# Patient Record
Sex: Male | Born: 1950
Health system: Southern US, Community
[De-identification: ages and names within clinical notes are randomized; demographics above are authoritative.]

## PROBLEM LIST (undated history)

## (undated) DIAGNOSIS — E781 Pure hyperglyceridemia: Secondary | ICD-10-CM

## (undated) DIAGNOSIS — R001 Bradycardia, unspecified: Secondary | ICD-10-CM

## (undated) DIAGNOSIS — N529 Male erectile dysfunction, unspecified: Secondary | ICD-10-CM

## (undated) DIAGNOSIS — N200 Calculus of kidney: Secondary | ICD-10-CM

## (undated) HISTORY — DX: Male erectile dysfunction, unspecified: N52.9

## (undated) HISTORY — DX: Pure hyperglyceridemia: E78.1

## (undated) HISTORY — DX: Calculus of kidney: N20.0

## (undated) HISTORY — DX: Bradycardia, unspecified: R00.1

---

## 2000-09-27 ENCOUNTER — Emergency Department (HOSPITAL_COMMUNITY): Admission: EM | Admit: 2000-09-27 | Discharge: 2000-09-27 | Payer: Self-pay | Admitting: Emergency Medicine

## 2000-09-27 ENCOUNTER — Encounter: Payer: Self-pay | Admitting: Emergency Medicine

## 2000-09-27 ENCOUNTER — Encounter: Payer: Self-pay | Admitting: Orthopedic Surgery

## 2003-04-21 ENCOUNTER — Encounter: Payer: Self-pay | Admitting: Emergency Medicine

## 2003-04-21 ENCOUNTER — Emergency Department (HOSPITAL_COMMUNITY): Admission: EM | Admit: 2003-04-21 | Discharge: 2003-04-21 | Payer: Self-pay | Admitting: Emergency Medicine

## 2014-04-04 ENCOUNTER — Other Ambulatory Visit: Payer: Self-pay | Admitting: Allergy and Immunology

## 2014-04-04 ENCOUNTER — Ambulatory Visit
Admission: RE | Admit: 2014-04-04 | Discharge: 2014-04-04 | Disposition: A | Discharge status: Home / Self Care | Payer: BC Managed Care – PPO | Source: Ambulatory Visit | Attending: Allergy and Immunology | Admitting: Allergy and Immunology

## 2014-04-04 DIAGNOSIS — J45901 Unspecified asthma with (acute) exacerbation: Secondary | ICD-10-CM

## 2017-03-17 ENCOUNTER — Telehealth: Payer: Self-pay | Admitting: *Deleted

## 2017-03-17 NOTE — Telephone Encounter (Signed)
NOTES SENT TO SCHEDULING.  °

## 2017-03-18 ENCOUNTER — Telehealth: Payer: Self-pay

## 2017-03-18 NOTE — Telephone Encounter (Signed)
SENT NOTES TO [facility] ON 03/18/2017

## 2017-03-21 ENCOUNTER — Telehealth: Payer: Self-pay | Admitting: Internal Medicine

## 2017-03-21 NOTE — Telephone Encounter (Signed)
Received records from [facility] for appointment on 04/17/17 with Dr Rennis Golden.  Records put with Dr Blanchie Dessert schedule for 04/17/17. lp

## 2017-04-17 ENCOUNTER — Encounter: Payer: Self-pay | Admitting: Internal Medicine

## 2017-04-17 ENCOUNTER — Ambulatory Visit (INDEPENDENT_AMBULATORY_CARE_PROVIDER_SITE_OTHER): Payer: Medicare Other | Admitting: Internal Medicine

## 2017-04-17 VITALS — BP 131/87 | HR 62 | Ht 74.0 in | Wt 205.0 lb

## 2017-04-17 DIAGNOSIS — R001 Bradycardia, unspecified: Secondary | ICD-10-CM | POA: Diagnosis not present

## 2017-04-17 NOTE — Progress Notes (Signed)
OFFICE CONSULT NOTE  Chief Complaint:  Asymptomatic bradycardia  Primary Care Physician: Marcelo BaldyMAUNEY, JESSICA S, PA-C  HPI:  Nathan Mendez is a 66 y.o. male who is being seen today for the evaluation of bradycardia at the request of Marcelo BaldyMauney, Jessica S, [place]. 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 He also has seasonal allergies/asthma. He only takes medications essentially for his asthma and allergies. Family history significant for coronary artery disease in his father who died of a heart attack in his 8250s however he was apparently overweight and a heavy smoker. Nathan Mendez recently had a wellness visit and was noted to be bradycardic. EKG was performed indicating a sinus bradycardia with heart rate of 49 which I personally reviewed. I also personally reviewed lab work from his PCP including electrolytes which were normal. Creatinine in normal limits. A total cholesterol 213, triglycerides 159, HDL 48 and LDL-C of 133. Non-HDL of 165. He is not currently on therapy. He reports being active and exercising regularly. He only drinks a couple alcoholic beverages a week. He denies any drug use or tobacco use. With regards to bradycardia he says he is completely asymptomatic. When he exercises he notes his heart rate does go up with exercise accordingly up into the 120-140 range. He denies any chest pain, worsening shortness of breath, presyncope, syncope or other associated symptoms.  PMHx:  Past Medical History:  Diagnosis Date  . ED (erectile dysfunction)   . High triglycerides   . Kidney stone   . Sinus bradycardia     No past surgical history on file.  FAMHx:  Family History  Problem Relation Age of Onset  . Heart failure Mother   . Heart attack Father     SOCHx:   reports that he has never smoked. He has never used smokeless tobacco. He reports that he does not  use drugs. His alcohol history is not on file.  ALLERGIES:  Allergies  Allergen Reactions  . Hornet Venom Swelling  . Yellow Jacket Venom [Bee Venom] Swelling    ROS: Pertinent items noted in HPI and remainder of comprehensive ROS otherwise negative.  HOME MEDS: Current Outpatient Prescriptions on File Prior to Visit  Medication Sig Dispense Refill  . albuterol (PROVENTIL HFA;VENTOLIN HFA) 108 (90 Base) MCG/ACT inhaler Inhale 2 puffs into the lungs every 6 (six) hours as needed for wheezing or shortness of breath.    . EPINEPHrine 0.3 mg/0.3 mL IJ SOAJ injection Inject 0.3 mg into the muscle once.    . loratadine (CLARITIN) 10 MG tablet Take 10 mg by mouth daily.    . mometasone-formoterol (DULERA) 100-5 MCG/ACT AERO Inhale 2 puffs into the lungs 2 (two) times daily.    . montelukast (SINGULAIR) 10 MG tablet Take 10 mg by mouth every evening.  5  . sildenafil (VIAGRA) 100 MG tablet Take 50 mg by mouth daily as needed for erectile dysfunction.     No current facility-administered medications on file prior to visit.     LABS/IMAGING: No results found for this or any previous visit (from the past 48 hour(s)). No results found.  LIPID PANEL: No results found for: CHOL, TRIG, HDL, CHOLHDL, VLDL, LDLCALC, LDLDIRECT  WEIGHTS: Wt Readings from Last 3 Encounters:  04/17/17 205 lb (93 kg)    VITALS: BP 131/87   Pulse 62   Ht 6\' 2"  (1.88 m)   Wt 205 lb (93 kg)  BMI 26.32 kg/m   EXAM: General appearance: alert, no distress and thin Neck: no carotid bruit, no JVD and thyroid not enlarged, symmetric, no tenderness/mass/nodules Lungs: clear to auscultation bilaterally Heart: regular rate and rhythm, S1, S2 normal, no murmur, click, rub or gallop Abdomen: soft, non-tender; bowel sounds normal; no masses,  no organomegaly Extremities: extremities normal, atraumatic, no cyanosis or edema Pulses: 2+ and symmetric Skin: Skin color, texture, turgor normal. No rashes or  lesions Neurologic: Grossly normal Psych: Pleasant  EKG: Normal sinus rhythm at 60, minimal voltage criteria for LVH - personally reviewed  ASSESSMENT: 1. Asymptomatic sinus bradycardia  PLAN: 1.   [name] has a dramatic sinus bradycardia. There is no evidence of that today. He reports heart rate increase with exercise suggesting he has normal chronotropic competence. He has had no anginal symptoms. Given his high exercise tolerance I don't think it would be beneficial for him to undergo stress testing as he essentially is able to do that when he exercises. He occasionally has some acid reflux which could be a vagal stimulant. High vagal tone could explain his low heart rate at times although this is unusual that it is a new finding. Could also suggest the development of conduction system disease, although the EKG does not suggest that this time. I've encouraged him to monitor his heart rate with exercise and look for any further symptoms. I did offer stress testing although he politely declined. It is unlikely that it would show anything different than what he normally sees with exercise. He should avoid any rate lowering medications.  Thanks for the consultation. Follow-up with me as needed.  Chrystie Nose, MD, [facility]  [place]  [facility]  Attending Cardiologist  Direct Dial: [phone]  Fax: [phone]  Website:  [email or web address]  Lisette Abu Katherinne Mofield 04/17/2017, 10:30 AM

## 2017-04-17 NOTE — Patient Instructions (Signed)
Your physician recommends that you schedule a follow-up appointment as needed  

## 2017-05-05 ENCOUNTER — Ambulatory Visit (INDEPENDENT_AMBULATORY_CARE_PROVIDER_SITE_OTHER): Payer: Medicare Other | Admitting: Podiatry

## 2017-05-05 ENCOUNTER — Encounter: Payer: Self-pay | Admitting: Podiatry

## 2017-05-05 VITALS — BP 150/98 | HR 55 | Resp 16 | Ht 74.0 in | Wt 190.0 lb

## 2017-05-05 DIAGNOSIS — L600 Ingrowing nail: Secondary | ICD-10-CM | POA: Diagnosis not present

## 2017-05-05 DIAGNOSIS — B351 Tinea unguium: Secondary | ICD-10-CM

## 2017-05-05 NOTE — Progress Notes (Signed)
   Subjective:    Patient ID: Nathan Mendez, male    DOB: 23-Jun-1951, 66 y.o.   MRN: [id number]  HPI Chief Complaint  Patient presents with  . Nail Problem    Left foot; great toe; nail discoloration & thickened nail; pt stated, "Goes hiking; has had for past year"      Review of Systems  All other systems reviewed and are negative.      Objective:   Physical Exam        Assessment & Plan:

## 2017-05-06 NOTE — Progress Notes (Signed)
Subjective:    Patient ID: Nathan Mendez, male   DOB: 66 y.o.   MRN: [id number]   HPI patient presents stating he's had a lot of thickness of his toenails and it makes it difficult at times for him to wear shoe gear comfortably and he like to know if it can be treated    Review of Systems  All other systems reviewed and are negative.       Objective:  Physical Exam  Constitutional: He is oriented to person, place, and time.  Cardiovascular: Intact distal pulses.   Musculoskeletal: Normal range of motion.  Neurological: He is alert and oriented to person, place, and time.  Skin: Skin is warm.  Nursing note and vitals reviewed.  neurovascular status intact muscle strength adequate with significant damage to the hallux nail left with incurvation of the bed and mild damage right. Patient very active hiker and stated been occurring over the last few years     Assessment:    Damage nailbed left hallux with thickness incurvation and minimal discomfort     Plan:   I educated him on the differences between fungus and probable trauma and that most of the problem here appears to be trauma. Patient at this time we'll not have treatment but may require nail removal or other treatment in future

## 2020-01-12 ENCOUNTER — Ambulatory Visit: Payer: Medicare Other

## 2020-02-02 ENCOUNTER — Ambulatory Visit: Payer: Medicare Other

## 2020-08-24 DIAGNOSIS — Z20828 Contact with and (suspected) exposure to other viral communicable diseases: Secondary | ICD-10-CM | POA: Diagnosis not present

## 2020-12-06 DIAGNOSIS — N529 Male erectile dysfunction, unspecified: Secondary | ICD-10-CM | POA: Diagnosis not present

## 2020-12-06 DIAGNOSIS — Z79899 Other long term (current) drug therapy: Secondary | ICD-10-CM | POA: Diagnosis not present

## 2020-12-06 DIAGNOSIS — J309 Allergic rhinitis, unspecified: Secondary | ICD-10-CM | POA: Diagnosis not present

## 2020-12-06 DIAGNOSIS — E663 Overweight: Secondary | ICD-10-CM | POA: Diagnosis not present

## 2020-12-06 DIAGNOSIS — E785 Hyperlipidemia, unspecified: Secondary | ICD-10-CM | POA: Diagnosis not present

## 2020-12-06 DIAGNOSIS — T63441D Toxic effect of venom of bees, accidental (unintentional), subsequent encounter: Secondary | ICD-10-CM | POA: Diagnosis not present

## 2020-12-06 DIAGNOSIS — J4520 Mild intermittent asthma, uncomplicated: Secondary | ICD-10-CM | POA: Diagnosis not present

## 2020-12-06 DIAGNOSIS — Z6827 Body mass index (BMI) 27.0-27.9, adult: Secondary | ICD-10-CM | POA: Diagnosis not present

## 2021-03-15 DIAGNOSIS — J3081 Allergic rhinitis due to animal (cat) (dog) hair and dander: Secondary | ICD-10-CM | POA: Diagnosis not present

## 2021-03-15 DIAGNOSIS — J301 Allergic rhinitis due to pollen: Secondary | ICD-10-CM | POA: Diagnosis not present

## 2021-03-15 DIAGNOSIS — J3089 Other allergic rhinitis: Secondary | ICD-10-CM | POA: Diagnosis not present

## 2021-03-15 DIAGNOSIS — J4540 Moderate persistent asthma, uncomplicated: Secondary | ICD-10-CM | POA: Diagnosis not present

## 2021-03-22 ENCOUNTER — Ambulatory Visit (INDEPENDENT_AMBULATORY_CARE_PROVIDER_SITE_OTHER): Payer: Medicare PPO

## 2021-03-22 ENCOUNTER — Other Ambulatory Visit: Payer: Self-pay

## 2021-03-22 ENCOUNTER — Encounter (HOSPITAL_COMMUNITY): Payer: Self-pay | Admitting: *Deleted

## 2021-03-22 ENCOUNTER — Ambulatory Visit (HOSPITAL_COMMUNITY)
Admission: EM | Admit: 2021-03-22 | Discharge: 2021-03-22 | Disposition: A | Discharge status: Home / Self Care | Payer: Medicare PPO | Attending: Emergency Medicine | Admitting: Emergency Medicine

## 2021-03-22 DIAGNOSIS — R0781 Pleurodynia: Secondary | ICD-10-CM

## 2021-03-22 DIAGNOSIS — S2241XA Multiple fractures of ribs, right side, initial encounter for closed fracture: Secondary | ICD-10-CM | POA: Diagnosis not present

## 2021-03-22 DIAGNOSIS — X500XXA Overexertion from strenuous movement or load, initial encounter: Secondary | ICD-10-CM | POA: Diagnosis not present

## 2021-03-22 DIAGNOSIS — R0789 Other chest pain: Secondary | ICD-10-CM | POA: Diagnosis not present

## 2021-03-22 NOTE — ED Triage Notes (Signed)
Pt reports Rt upper rib pain after lifting bags of dirt on Tuesday. Pt reports pain worse at night when going to bed.

## 2021-03-22 NOTE — ED Provider Notes (Signed)
[facility]    CSN: [id number] Arrival date & time: 03/22/21  0809      History   Chief Complaint Chief Complaint  Patient presents with  . Chest Pain    HPI Nathan Mendez is a 70 y.o. male.   Patient is here for evaluation of right rib pain.  Reports pain started Tuesday evening after lifting heavy bags of dirt.  Reports being unable to lay flat on ribs.  Pain worsens throughout the day.  Has not tried any OTC medications or treatments.  Primarily concerned about possible rib fracture as he will be traveling outside of the country in the near future. Denies any fevers, chest pain, shortness of breath, N/V/D, numbness, tingling, weakness, abdominal pain, or headaches.   Also has 2 small abrasions to the top of his head from where he hit his head.  Reports they are healing nicely but then recently hit his head again and wanted to make sure there were not no signs of any additional trauma.  Denies any headaches, dizziness, blurred vision.  Denies any uncontrolled bleeding or drainage from site.   ROS: As per HPI, all other pertinent ROS negative   The history is provided by the patient.  Chest Pain Associated symptoms: no palpitations     Past Medical History:  Diagnosis Date  . ED (erectile dysfunction)   . High triglycerides   . Kidney stone   . Sinus bradycardia     Patient Active Problem List   Diagnosis Date Noted  . Bradycardia 04/17/2017    History reviewed. No pertinent surgical history.     Home Medications    Prior to Admission medications   Medication Sig Start Date End Date Taking? Authorizing Provider  albuterol (PROVENTIL HFA;VENTOLIN HFA) 108 (90 Base) MCG/ACT inhaler Inhale 2 puffs into the lungs every 6 (six) hours as needed for wheezing or shortness of breath.    [provider]  EPINEPHrine 0.3 mg/0.3 mL IJ SOAJ injection Inject 0.3 mg into the muscle once.    [provider]  loratadine (CLARITIN) 10 MG tablet Take  10 mg by mouth daily.    [provider]  mometasone-formoterol (DULERA) 100-5 MCG/ACT AERO Inhale 2 puffs into the lungs 2 (two) times daily.    [provider]  montelukast (SINGULAIR) 10 MG tablet Take 10 mg by mouth every evening. 01/14/17   [provider]  sildenafil (VIAGRA) 100 MG tablet Take 50 mg by mouth daily as needed for erectile dysfunction.    [provider]    Family History Family History  Problem Relation Age of Onset  . Heart failure Mother   . Heart attack Father     Social History Social History   Tobacco Use  . Smoking status: Never Smoker  . Smokeless tobacco: Never Used  Substance Use Topics  . Drug use: No     Allergies   Hornet venom and Yellow jacket venom [bee venom]   Review of Systems Review of Systems  Cardiovascular: Positive for chest pain. Negative for palpitations and leg swelling.  Skin: Positive for wound.     Physical Exam Triage Vital Signs ED Triage Vitals  Enc Vitals Group     BP 03/22/21 0826 (!) 144/84     Pulse Rate 03/22/21 0826 71     Resp 03/22/21 0826 18     Temp 03/22/21 0826 98.2 F (36.8 C)     Temp Source 03/22/21 0826 Oral     SpO2  03/22/21 0826 95 %     Weight --      Height --      Head Circumference --      Peak Flow --      Pain Score 03/22/21 0823 8     Pain Loc --      Pain Edu? --      Excl. in GC? --    No data found.  Updated Vital Signs BP (!) 144/84 (BP Location: Left Arm)   Pulse 71   Temp 98.2 F (36.8 C) (Oral)   Resp 18   SpO2 95%   Visual Acuity Right Eye Distance:   Left Eye Distance:   Bilateral Distance:    Right Eye Near:   Left Eye Near:    Bilateral Near:     Physical Exam Vitals and nursing note reviewed.  Constitutional:      General: He is not in acute distress.    Appearance: Normal appearance. He is not ill-appearing, toxic-appearing or diaphoretic.  HENT:     Head: Normocephalic and atraumatic.  Eyes:      Conjunctiva/sclera: Conjunctivae normal.  Cardiovascular:     Rate and Rhythm: Normal rate.     Pulses: Normal pulses.  Pulmonary:     Effort: Pulmonary effort is normal.  Chest:     Chest wall: Tenderness (right rib cage tenderness with minimal radiatation) present. No mass, deformity, crepitus or edema. There is no dullness to percussion.  Abdominal:     General: Abdomen is flat.  Musculoskeletal:        General: Normal range of motion.     Cervical back: Normal range of motion.  Skin:    General: Skin is warm and dry.     Findings: Abrasion (two small abraisions to top of head, bleeding controlled with no redness or swelling, no tenderness, appears to be healing well) present.  Neurological:     General: No focal deficit present.     Mental Status: He is alert and oriented to person, place, and time.  Psychiatric:        Mood and Affect: Mood normal.      UC Treatments / Results  Labs (all labs ordered are listed, but only abnormal results are displayed) Labs Reviewed - No data to display  EKG   Radiology DG Ribs Unilateral W/Chest Right  Result Date: 03/22/2021 CLINICAL DATA:  Pain after lifting heavy objects EXAM: RIGHT RIBS AND CHEST - 3+ VIEW COMPARISON:  Chest radiograph April 04, 2014. FINDINGS: Frontal chest as well as oblique and cone-down rib images obtained. There is atelectatic change in the right base with equivocal right pleural effusion. The lungs elsewhere are clear. The heart size and pulmonary vascularity are normal. No adenopathy. No pneumothorax or pleural effusion. Old healed fractures of the anterior right ninth and tenth ribs obtained. No acute appearing fracture is evident. IMPRESSION: Prior fractures of the right ninth and tenth ribs anteriorly with apparent healing response. No acute fracture evident. Equivocal right pleural effusion with right base atelectasis noted. No evident pneumothorax. Lungs otherwise clear. Electronically Signed   By: Bretta Bang III M.D.   On: 03/22/2021 09:08    Procedures Procedures (including critical care time)  Medications Ordered in UC Medications - No data to display  Initial Impression / Assessment and Plan / UC Course  I have reviewed the triage vital signs and the nursing notes.  Pertinent labs & imaging results that were available during my care of  the patient were reviewed by me and considered in my medical decision making (see chart for details).     For red flags or concerns.  X-ray showed healing fractures of the right ninth and 10th anterior ribs but no acute fractures.  Incidental finding of right pleural effusion.  Discussed finding with patient and recommend follow-up with PCP.  Discussed following up or going to the emergency room for any worsening shortness of breath, chest pain, cough, or coughing up bloody sputum. Continue conservative management with ibuprofen and/or Tylenol as needed for pain.  Offered to prescribe naproxen and muscle relaxer but declined at this time.  Recommend alternating heat and ice for comfort. Follow-up with primary care as needed.  Final Clinical Impressions(s) / UC Diagnoses   Final diagnoses:  Chest wall pain  Rib pain on right side     Discharge Instructions     Follow up with your Primary Care Provider for the fluid on your lung.   Make sure that you are taking deep breathes, brace your ribs when coughing for comfort.   Take the Tylenol and/or Ibuprofen as needed for pain.   Return or go to the Emergency Department if symptoms worsen or do not improve in the next few days.      ED Prescriptions    None     PDMP not reviewed this encounter.   Ivette Loyal, NP 03/22/21 [phone]

## 2021-03-22 NOTE — Discharge Instructions (Signed)
Follow up with your Primary Care Provider for the fluid on your lung.   Make sure that you are taking deep breathes, brace your ribs when coughing for comfort.   Take the Tylenol and/or Ibuprofen as needed for pain.   Return or go to the Emergency Department if symptoms worsen or do not improve in the next few days.

## 2021-03-28 ENCOUNTER — Other Ambulatory Visit: Payer: Medicare PPO | Admitting: Family Medicine

## 2021-03-28 DIAGNOSIS — J90 Pleural effusion, not elsewhere classified: Secondary | ICD-10-CM | POA: Diagnosis not present

## 2021-03-28 DIAGNOSIS — Z6827 Body mass index (BMI) 27.0-27.9, adult: Secondary | ICD-10-CM | POA: Diagnosis not present

## 2021-03-28 DIAGNOSIS — Z91030 Bee allergy status: Secondary | ICD-10-CM | POA: Diagnosis not present

## 2021-03-28 DIAGNOSIS — R0789 Other chest pain: Secondary | ICD-10-CM | POA: Diagnosis not present

## 2021-03-29 ENCOUNTER — Ambulatory Visit
Admission: RE | Admit: 2021-03-29 | Discharge: 2021-03-29 | Disposition: A | Discharge status: Home / Self Care | Payer: Medicare PPO | Source: Ambulatory Visit | Attending: Family Medicine | Admitting: Family Medicine

## 2021-03-29 ENCOUNTER — Other Ambulatory Visit: Payer: Self-pay

## 2021-03-29 DIAGNOSIS — J929 Pleural plaque without asbestos: Secondary | ICD-10-CM | POA: Diagnosis not present

## 2021-03-29 DIAGNOSIS — I2699 Other pulmonary embolism without acute cor pulmonale: Secondary | ICD-10-CM | POA: Diagnosis not present

## 2021-03-29 DIAGNOSIS — J90 Pleural effusion, not elsewhere classified: Secondary | ICD-10-CM

## 2021-03-29 DIAGNOSIS — Z125 Encounter for screening for malignant neoplasm of prostate: Secondary | ICD-10-CM | POA: Diagnosis not present

## 2021-03-29 DIAGNOSIS — K449 Diaphragmatic hernia without obstruction or gangrene: Secondary | ICD-10-CM | POA: Diagnosis not present

## 2021-03-29 MED ORDER — IOPAMIDOL (ISOVUE-370) INJECTION 76%
75.0000 mL | Freq: Once | INTRAVENOUS | Status: AC | PRN
  Administered 2021-03-29: 75 mL via INTRAVENOUS

## 2021-04-02 DIAGNOSIS — J90 Pleural effusion, not elsewhere classified: Secondary | ICD-10-CM | POA: Diagnosis not present

## 2021-04-02 DIAGNOSIS — J4520 Mild intermittent asthma, uncomplicated: Secondary | ICD-10-CM | POA: Diagnosis not present

## 2021-04-02 DIAGNOSIS — I2693 Single subsegmental pulmonary embolism without acute cor pulmonale: Secondary | ICD-10-CM | POA: Diagnosis not present

## 2021-04-02 DIAGNOSIS — J189 Pneumonia, unspecified organism: Secondary | ICD-10-CM | POA: Diagnosis not present

## 2021-06-01 DIAGNOSIS — U071 COVID-19: Secondary | ICD-10-CM | POA: Diagnosis not present

## 2021-07-09 DIAGNOSIS — J189 Pneumonia, unspecified organism: Secondary | ICD-10-CM | POA: Diagnosis not present

## 2021-07-09 DIAGNOSIS — Z6827 Body mass index (BMI) 27.0-27.9, adult: Secondary | ICD-10-CM | POA: Diagnosis not present

## 2021-07-09 DIAGNOSIS — I2693 Single subsegmental pulmonary embolism without acute cor pulmonale: Secondary | ICD-10-CM | POA: Diagnosis not present

## 2021-07-17 ENCOUNTER — Other Ambulatory Visit: Payer: Self-pay | Admitting: Family Medicine

## 2021-07-17 DIAGNOSIS — I2693 Single subsegmental pulmonary embolism without acute cor pulmonale: Secondary | ICD-10-CM

## 2021-07-30 ENCOUNTER — Ambulatory Visit
Admission: RE | Admit: 2021-07-30 | Discharge: 2021-07-30 | Disposition: A | Discharge status: Home / Self Care | Payer: Medicare PPO | Source: Ambulatory Visit | Attending: Family Medicine | Admitting: Family Medicine

## 2021-07-30 ENCOUNTER — Other Ambulatory Visit: Payer: Self-pay

## 2021-07-30 DIAGNOSIS — N200 Calculus of kidney: Secondary | ICD-10-CM | POA: Diagnosis not present

## 2021-07-30 DIAGNOSIS — I2693 Single subsegmental pulmonary embolism without acute cor pulmonale: Secondary | ICD-10-CM

## 2021-07-30 MED ORDER — IOPAMIDOL (ISOVUE-370) INJECTION 76%
75.0000 mL | Freq: Once | INTRAVENOUS | Status: AC | PRN
  Administered 2021-07-30: 75 mL via INTRAVENOUS

## 2021-12-07 DIAGNOSIS — N529 Male erectile dysfunction, unspecified: Secondary | ICD-10-CM | POA: Diagnosis not present

## 2021-12-07 DIAGNOSIS — Z86711 Personal history of pulmonary embolism: Secondary | ICD-10-CM | POA: Diagnosis not present

## 2021-12-07 DIAGNOSIS — E785 Hyperlipidemia, unspecified: Secondary | ICD-10-CM | POA: Diagnosis not present

## 2021-12-21 DIAGNOSIS — Z0000 Encounter for general adult medical examination without abnormal findings: Secondary | ICD-10-CM | POA: Diagnosis not present

## 2021-12-21 DIAGNOSIS — Z1389 Encounter for screening for other disorder: Secondary | ICD-10-CM | POA: Diagnosis not present

## 2022-03-28 DIAGNOSIS — J3081 Allergic rhinitis due to animal (cat) (dog) hair and dander: Secondary | ICD-10-CM | POA: Diagnosis not present

## 2022-03-28 DIAGNOSIS — J301 Allergic rhinitis due to pollen: Secondary | ICD-10-CM | POA: Diagnosis not present

## 2022-03-28 DIAGNOSIS — J4540 Moderate persistent asthma, uncomplicated: Secondary | ICD-10-CM | POA: Diagnosis not present

## 2022-03-28 DIAGNOSIS — J3089 Other allergic rhinitis: Secondary | ICD-10-CM | POA: Diagnosis not present

## 2022-03-31 ENCOUNTER — Other Ambulatory Visit: Payer: Self-pay

## 2022-03-31 ENCOUNTER — Ambulatory Visit (HOSPITAL_COMMUNITY)
Admission: RE | Admit: 2022-03-31 | Discharge: 2022-03-31 | Disposition: A | Discharge status: Home / Self Care | Payer: Medicare PPO | Source: Ambulatory Visit | Attending: Physician Assistant | Admitting: Physician Assistant

## 2022-03-31 ENCOUNTER — Other Ambulatory Visit: Payer: Self-pay | Admitting: Physician Assistant

## 2022-03-31 ENCOUNTER — Emergency Department (HOSPITAL_COMMUNITY)
Admission: EM | Admit: 2022-03-31 | Discharge: 2022-03-31 | Disposition: A | Discharge status: Home / Self Care | Payer: Medicare PPO | Attending: Emergency Medicine | Admitting: Emergency Medicine

## 2022-03-31 ENCOUNTER — Emergency Department (HOSPITAL_COMMUNITY): Admission: EM | Admit: 2022-03-31 | Payer: Medicare PPO | Source: Home / Self Care

## 2022-03-31 ENCOUNTER — Emergency Department (HOSPITAL_COMMUNITY): Payer: Medicare PPO

## 2022-03-31 ENCOUNTER — Emergency Department (HOSPITAL_BASED_OUTPATIENT_CLINIC_OR_DEPARTMENT_OTHER): Payer: Medicare PPO

## 2022-03-31 ENCOUNTER — Encounter (HOSPITAL_COMMUNITY): Payer: Self-pay

## 2022-03-31 DIAGNOSIS — R001 Bradycardia, unspecified: Secondary | ICD-10-CM

## 2022-03-31 DIAGNOSIS — R55 Syncope and collapse: Secondary | ICD-10-CM

## 2022-03-31 DIAGNOSIS — R61 Generalized hyperhidrosis: Secondary | ICD-10-CM | POA: Diagnosis not present

## 2022-03-31 DIAGNOSIS — M25571 Pain in right ankle and joints of right foot: Secondary | ICD-10-CM | POA: Diagnosis not present

## 2022-03-31 DIAGNOSIS — L0390 Cellulitis, unspecified: Secondary | ICD-10-CM | POA: Diagnosis not present

## 2022-03-31 DIAGNOSIS — M79604 Pain in right leg: Secondary | ICD-10-CM | POA: Diagnosis not present

## 2022-03-31 DIAGNOSIS — I959 Hypotension, unspecified: Secondary | ICD-10-CM | POA: Diagnosis not present

## 2022-03-31 DIAGNOSIS — M79605 Pain in left leg: Secondary | ICD-10-CM | POA: Diagnosis not present

## 2022-03-31 DIAGNOSIS — M79662 Pain in left lower leg: Secondary | ICD-10-CM | POA: Diagnosis not present

## 2022-03-31 DIAGNOSIS — E860 Dehydration: Secondary | ICD-10-CM | POA: Insufficient documentation

## 2022-03-31 DIAGNOSIS — M76821 Posterior tibial tendinitis, right leg: Secondary | ICD-10-CM | POA: Diagnosis not present

## 2022-03-31 LAB — BASIC METABOLIC PANEL
Anion gap: 10 (ref 5–15)
BUN: 17 mg/dL (ref 8–23)
CO2: 24 mmol/L (ref 22–32)
Calcium: 8.8 mg/dL — ABNORMAL LOW (ref 8.9–10.3)
Chloride: 103 mmol/L (ref 98–111)
Creatinine, Ser: 1.39 mg/dL — ABNORMAL HIGH (ref 0.61–1.24)
GFR, Estimated: 55 mL/min — ABNORMAL LOW (ref >60)
Glucose, Bld: 139 mg/dL — ABNORMAL HIGH (ref 70–99)
Potassium: 4.8 mmol/L (ref 3.5–5.1)
Sodium: 137 mmol/L (ref 135–145)

## 2022-03-31 LAB — CBC WITH DIFFERENTIAL/PLATELET
Abs Immature Granulocytes: 0.05 10*3/uL (ref 0.00–0.07)
Basophils Absolute: 0.1 10*3/uL (ref 0.0–0.1)
Basophils Relative: 1 %
Eosinophils Absolute: 0.1 10*3/uL (ref 0.0–0.5)
Eosinophils Relative: 1 %
HCT: 50.6 % (ref 39.0–52.0)
Hemoglobin: 16.3 g/dL (ref 13.0–17.0)
Immature Granulocytes: 0 %
Lymphocytes Relative: 13 %
Lymphs Abs: 1.6 10*3/uL (ref 0.7–4.0)
MCH: 31.2 pg (ref 26.0–34.0)
MCHC: 32.2 g/dL (ref 30.0–36.0)
MCV: 96.9 fL (ref 80.0–100.0)
Monocytes Absolute: 0.8 10*3/uL (ref 0.1–1.0)
Monocytes Relative: 6 %
Neutro Abs: 9.6 10*3/uL — ABNORMAL HIGH (ref 1.7–7.7)
Neutrophils Relative %: 79 %
Platelets: 227 10*3/uL (ref 150–400)
RBC: 5.22 MIL/uL (ref 4.22–5.81)
RDW: 12.4 % (ref 11.5–15.5)
WBC: 12.2 10*3/uL — ABNORMAL HIGH (ref 4.0–10.5)
nRBC: 0 % (ref 0.0–0.2)

## 2022-03-31 NOTE — Discharge Instructions (Signed)
As discussed, your evaluation today has been largely reassuring.  But, it is important that you monitor your condition carefully, and do not hesitate to return to the ED if you develop new, or concerning changes in your condition. ? ?Otherwise, please follow-up with your physician for appropriate ongoing care. ? ?

## 2022-03-31 NOTE — ED Provider Triage Note (Signed)
Emergency Medicine Provider Triage Evaluation Note ? ?Nathan Mendez , a 71 y.o. male  was evaluated in triage.  History of hypertriglyceridemia, bradycardia, kidney stones. ? ?Patient was in the orthopedic office today for evaluation of ankle pain and erythema x2 days, patient reports that orthopedist was concerned for possible infection and gave him a shot of an anti-inflammatory as well as an antibiotic.  Patient reports that immediately after he received the shot of the antibiotic he began to feel lightheaded, he walked out to the lobby where he sat down.  Patient felt more lightheaded after a few moments and then had a syncopal episode he reports that he stayed in the chair during the episode he did not fall to the ground or have any injuries.  He denies any preceding chest pain or shortness of breath.  Patient reports that he is feeling well at this time he has no other complaints or concerns.  Of note patient reports that he was supposed to have a DVT study of his leg today as well. ? ?Review of Systems  ?Positive: Right ankle pain and erythema.  Syncope. ?Negative: Chest pain, shortness of breath, headache, abdominal pain, nausea, vomiting, recent illness or any additional concerns ? ?Physical Exam  ?There were no vitals taken for this visit. ?Gen:   Awake, no distress   ?Resp:  Normal effort lungs clear to auscultation ?MSK:   Moves extremities without difficulty.  Small amount of erythema overlying the medial malleolus.  Otherwise unremarkable examination.  No significant lower extremity edema.  No calf pain. ?Other:  Heart regular rate and rhythm. ? ?Medical Decision Making  ?Medically screening exam initiated at 2:19 PM.  Appropriate orders placed.  Vicki Pasqual was informed that the remainder of the evaluation will be completed by another provider, this initial triage assessment does not replace that evaluation, and the importance of remaining in the ED until their evaluation is  complete. ? ? ?Note: Portions of this report may have been transcribed using voice recognition software. Every effort was made to ensure accuracy; however, inadvertent computerized transcription errors may still be present. ?  ?Bill Salinas, PA-C ?03/31/22 1426 ? ?

## 2022-03-31 NOTE — ED Notes (Signed)
Vascular complete, -DVT ?

## 2022-03-31 NOTE — ED Notes (Signed)
Pt did not want to change into a gown, states "I'm done getting checked out and examined for something else, I am here for my ankle." ?

## 2022-03-31 NOTE — ED Triage Notes (Signed)
Patient arrived from ortho office after being seen for ankle swelling and redness. Patient reports that he was given antibiotic shot and then had syncopal episode. Patient states that he felt immediately weak following shot. EMS reports initial BP 70. On arrival t ED alert and oriented, denies pain ?

## 2022-03-31 NOTE — ED Provider Notes (Signed)
?[facility] EMERGENCY DEPARTMENT ?Provider Note ? ? ?CSN: [id number] ?Arrival date & time: 03/31/22  1351 ? ?  ? ?History ? ?Chief Complaint  ?Patient presents with  ? Loss of Consciousness  ? ? ?Nathan Mendez is a 71 y.o. male. ? ?HPI ?Patient presents with his wife who assists with the history. ?Presents after episode of syncope. ?Notably, the patient has prior history of pulmonary embolism, but has no chest pain, dyspnea currently, nor recently. ?Is active, hiker, and after several days of activity developed pain in his right ankle.  Due to this he went to orthopedist urgent care today.  There he was seemingly diagnosed with cellulitis and inflammation, received intramuscular injection of antibiotic and steroid.  Subsequently had an episode of lightheadedness, and syncope. ?Per report patient's blood pressure was 100/60 on arrival, improved slightly in transport. ?Currently has no complaints. ?He was sent to the ED for ultrasound of his lower extremities to exclude DVT given history of pulmonary embolism.  He has been taking Eliquis as directed, daily. ?  ? ?Home Medications ?Prior to Admission medications   ?Medication Sig Start Date End Date Taking? Authorizing Provider  ?albuterol (PROVENTIL HFA;VENTOLIN HFA) 108 (90 Base) MCG/ACT inhaler Inhale 2 puffs into the lungs every 6 (six) hours as needed for wheezing or shortness of breath.    [provider]  ?EPINEPHrine 0.3 mg/0.3 mL IJ SOAJ injection Inject 0.3 mg into the muscle once.    [provider]  ?loratadine (CLARITIN) 10 MG tablet Take 10 mg by mouth daily.    [provider]  ?mometasone-formoterol (DULERA) 100-5 MCG/ACT AERO Inhale 2 puffs into the lungs 2 (two) times daily.    [provider]  ?montelukast (SINGULAIR) 10 MG tablet Take 10 mg by mouth every evening. 01/14/17   [provider]  ?sildenafil (VIAGRA) 100 MG tablet Take 50 mg by mouth daily as needed for erectile dysfunction.     [provider]  ?   ? ?Allergies    ?Hornet venom and Yellow jacket venom [bee venom]   ? ?Review of Systems   ?Review of Systems  ?Constitutional:   ?     Per HPI, otherwise negative  ?HENT:    ?     Per HPI, otherwise negative  ?Respiratory:    ?     Per HPI, otherwise negative  ?Cardiovascular:   ?     Per HPI, otherwise negative  ?Gastrointestinal:  Negative for vomiting.  ?Endocrine:  ?     Negative aside from HPI  ?Genitourinary:   ?     Neg aside from HPI   ?Musculoskeletal:   ?     Per HPI, otherwise negative  ?Skin: Negative.   ?Neurological:  Negative for syncope.  ? ?Physical Exam ?Updated Vital Signs ?BP (!) 139/97 (BP Location: Right Arm)   Pulse (!) 54   Temp 98 ?F (36.7 ?C) (Oral)   Resp 18   SpO2 100%  ?Physical Exam ?Vitals and nursing note reviewed.  ?Constitutional:   ?   General: He is not in acute distress. ?   Appearance: He is well-developed.  ?HENT:  ?   Head: Normocephalic and atraumatic.  ?Eyes:  ?   Conjunctiva/sclera: Conjunctivae normal.  ?Cardiovascular:  ?   Rate and Rhythm: Normal rate and regular rhythm.  ?Pulmonary:  ?   Effort: Pulmonary effort is normal. No respiratory distress.  ?   Breath sounds: No stridor.  ?Abdominal:  ?   General:  There is no distension.  ?Musculoskeletal:  ?     Legs: ? ?Skin: ?   General: Skin is warm and dry.  ?Neurological:  ?   Mental Status: He is alert and oriented to person, place, and time.  ? ? ?ED Results / Procedures / Treatments   ?Labs ?(all labs ordered are listed, but only abnormal results are displayed) ?Labs Reviewed  ?BASIC METABOLIC PANEL - Abnormal; Notable for the following components:  ?    Result Value  ? Glucose, Bld 139 (*)   ? Creatinine, Ser 1.39 (*)   ? Calcium 8.8 (*)   ? GFR, Estimated 55 (*)   ? All other components within normal limits  ?CBC WITH DIFFERENTIAL/PLATELET - Abnormal; Notable for the following components:  ? WBC 12.2 (*)   ? Neutro Abs 9.6 (*)   ? All other components within normal limits  ?CBG  MONITORING, ED  ? ? ?EKG ?EKG Interpretation ? ?Date/Time:  Sunday March 31 2022 13:40:39 EDT ?Ventricular Rate:  54 ?PR Interval:  172 ?QRS Duration: 84 ?QT Interval:  442 ?QTC Calculation: 419 ?R Axis:   -37 ?Text Interpretation: Sinus bradycardia Possible Left atrial enlargement Left axis deviation Minimal voltage criteria for LVH, may be normal variant ( R in aVL ) Abnormal ECG Confirmed by Gerhard MunchLockwood, Chick [phone]([id number]) on 03/31/2022 6:18:16 PM ? ?Radiology ?DG Chest 2 View ? ?Result Date: 03/31/2022 ?CLINICAL DATA:  Syncope EXAM: CHEST - 2 VIEW COMPARISON:  03/28/2021 FINDINGS: Transverse diameter of heart is slightly increased. There are no signs of pulmonary edema or focal pulmonary consolidation. There is interval clearing of right pleural effusion and infiltrate in the right lower lung fields. There is no pleural effusion or pneumothorax. IMPRESSION: No active cardiopulmonary disease. Electronically Signed   By: Ernie AvenaPalani  Rathinasamy M.D.   On: 03/31/2022 14:58  ? ?VAS US LOWER EXTREMITY VENOUS (DVT) (ONLY [facility] & [facility]) ? ?Result Date: 03/31/2022 ? Lower Venous DVT Study Patient Name:  Nathan Mendez  Date of Exam:   03/31/2022 Medical Rec #: [id number]         Accession #:    [id number] Date of Birth: Apr 21, 1951         Patient Gender: M Patient Age:   [age] years [facility] Procedure:      VAS US LOWER EXTREMITY VENOUS (DVT) Referring Phys: Harlene SaltsBRANDON MORELLI --------------------------------------------------------------------------------  Indications: Syncope, pain, swelling, and redness of the right ankle, hypotension, bradycardia.  Risk Factors: Confirmed PE 2022 Anticoagulation: Eliquis since 2022 Comparison Study: No prior study on file Performing Technologist: Sherren Kernsandace Kanady RVS  Examination Guidelines: A complete evaluation includes B-mode imaging, spectral Doppler, color Doppler, and power Doppler as needed of all accessible portions of each vessel. Bilateral testing is considered an integral  part of a complete examination. Limited examinations for reoccurring indications may be performed as noted. The reflux portion of the exam is performed with the patient in reverse Trendelenburg.  +---------+---------------+---------+-----------+----------+--------------+ RIGHT    CompressibilityPhasicitySpontaneityPropertiesThrombus Aging +---------+---------------+---------+-----------+----------+--------------+ CFV      Full           Yes      Yes                                 +---------+---------------+---------+-----------+----------+--------------+ SFJ      Full                                                        +---------+---------------+---------+-----------+----------+--------------+  FV Prox  Full                                                        +---------+---------------+---------+-----------+----------+--------------+ FV Mid   Full                                                        +---------+---------------+---------+-----------+----------+--------------+ FV DistalFull                                                        +---------+---------------+---------+-----------+----------+--------------+ PFV      Full                                                        +---------+---------------+---------+-----------+----------+--------------+ POP      Full           Yes      Yes                                 +---------+---------------+---------+-----------+----------+--------------+ PTV      Full                                                        +---------+---------------+---------+-----------+----------+--------------+ PERO     Full                                                        +---------+---------------+---------+-----------+----------+--------------+   +---------+---------------+---------+-----------+----------+--------------+ LEFT     CompressibilityPhasicitySpontaneityPropertiesThrombus Aging  +---------+---------------+---------+-----------+----------+--------------+ CFV      Full           Yes      Yes                                 +---------+---------------+---------+-----------+----------+--------------+ SFJ      Full

## 2022-03-31 NOTE — Progress Notes (Signed)
Right leg possible dvt ? ?

## 2022-03-31 NOTE — Progress Notes (Signed)
VASCULAR LAB ? ? ? ?Bilateral lower extremity venous duplex has been performed. ? ?See CV proc for preliminary results. ? ?Gave verbal report to Fidel Levy, RN ? ?Sherren Kerns, RVT ?03/31/2022, 3:17 PM ? ?

## 2022-04-01 DIAGNOSIS — M25571 Pain in right ankle and joints of right foot: Secondary | ICD-10-CM | POA: Diagnosis not present

## 2022-12-17 DIAGNOSIS — J309 Allergic rhinitis, unspecified: Secondary | ICD-10-CM | POA: Diagnosis not present

## 2022-12-17 DIAGNOSIS — Z6828 Body mass index (BMI) 28.0-28.9, adult: Secondary | ICD-10-CM | POA: Diagnosis not present

## 2022-12-17 DIAGNOSIS — M25561 Pain in right knee: Secondary | ICD-10-CM | POA: Diagnosis not present

## 2022-12-17 DIAGNOSIS — I2693 Single subsegmental pulmonary embolism without acute cor pulmonale: Secondary | ICD-10-CM | POA: Diagnosis not present

## 2022-12-17 DIAGNOSIS — J4520 Mild intermittent asthma, uncomplicated: Secondary | ICD-10-CM | POA: Diagnosis not present

## 2022-12-17 DIAGNOSIS — E785 Hyperlipidemia, unspecified: Secondary | ICD-10-CM | POA: Diagnosis not present

## 2022-12-17 DIAGNOSIS — N529 Male erectile dysfunction, unspecified: Secondary | ICD-10-CM | POA: Diagnosis not present

## 2023-01-03 DIAGNOSIS — G43109 Migraine with aura, not intractable, without status migrainosus: Secondary | ICD-10-CM | POA: Diagnosis not present

## 2023-01-03 DIAGNOSIS — H2513 Age-related nuclear cataract, bilateral: Secondary | ICD-10-CM | POA: Diagnosis not present

## 2023-01-03 DIAGNOSIS — H524 Presbyopia: Secondary | ICD-10-CM | POA: Diagnosis not present

## 2023-02-17 ENCOUNTER — Ambulatory Visit: Payer: Self-pay

## 2023-02-17 ENCOUNTER — Ambulatory Visit (INDEPENDENT_AMBULATORY_CARE_PROVIDER_SITE_OTHER): Payer: Medicare PPO

## 2023-02-17 ENCOUNTER — Ambulatory Visit: Payer: Medicare PPO | Admitting: Family Medicine

## 2023-02-17 VITALS — BP 128/84 | HR 68 | Ht 74.0 in | Wt 217.0 lb

## 2023-02-17 DIAGNOSIS — M170 Bilateral primary osteoarthritis of knee: Secondary | ICD-10-CM | POA: Diagnosis not present

## 2023-02-17 DIAGNOSIS — M25561 Pain in right knee: Secondary | ICD-10-CM

## 2023-02-17 DIAGNOSIS — G8929 Other chronic pain: Secondary | ICD-10-CM | POA: Diagnosis not present

## 2023-02-17 DIAGNOSIS — M25562 Pain in left knee: Secondary | ICD-10-CM

## 2023-02-17 NOTE — Patient Instructions (Addendum)
Thank you for coming in today.   Please get an Xray today before you leave   Please use Voltaren gel (Generic Diclofenac Gel) up to 4x daily for pain as needed.  This is available over-the-counter as both the name brand Voltaren gel and the generic diclofenac gel.   I've referred you to Physical Therapy.  Let us know if you don't hear from them in one week.   If not improving we can do more.   We could do a cortisone injection a week or two before your trip.

## 2023-02-17 NOTE — Progress Notes (Signed)
I, Peterson Lombard, LAT, ATC acting as a scribe for Lynne Leader, MD.  Subjective:    CC: Bilateral knee pain  HPI: Patient is a 72 year old male presenting with bilateral knee pain ongoing in R knee since Oct and L knee since the holidays.  [place] [facility] [employer] and had been running about 1/2 mile per day. Pt reports hx of meniscal injury in his R knee. Pt d/c running and working out until about 2 weeks ago. Pt is wondering if the development of knee pain is related to receiving the COVID booster. Patient locates pain to the anterior aspect of both knees.  Knee swelling: no Mechanical symptoms: yes Aggravates: stiff in the mornings, prolonged sitting then transitioning to stand Treatments tried: rest, Tylenol  Dx testing 03/31/2022 bilat LE vascular US  Pertinent review of Systems: No fevers or chills  Relevant historical information: Otherwise healthy   Objective:    Vitals:   02/17/23 1315  BP: 128/84  Pulse: 68  SpO2: 98%   General: Well Developed, well nourished, and in no acute distress.   MSK: Right knee mild joint effusion otherwise normal-appearing Normal motion with crepitation. Mildly tender palpation medial joint line. Mild laxity/instability MCL stress test. Intact strength.  Left knee: Mild joint effusion otherwise normal. Normal motion with crepitation. Mother tender palpation medial joint line. Mild laxity/instability MCL stress test. Intact strength.  Lab and Radiology Results  X-ray images bilateral knees obtained today personally and independently interpreted.  Right knee: Moderate to severe medial compartment DJD.  No acute fractures.  Left knee: Mild tricompartmental DJD.  No acute fractures.  Await for radiology review   Diagnostic Limited MSK Ultrasound of: Bilateral knees Mild joint effusion and mild degenerative changes medial joint line present bilateral knee ultrasound.  No Baker's cyst visible. Right knee  significant for degenerative appearing medial meniscus. Impression: DJD medial compartment.    Impression and Recommendations:    Assessment and Plan: 72 y.o. male with bilateral knee pain.  The pain thought to be due to exacerbation of DJD.  He may have a degenerative meniscus tear as well.  He does have a little bit of medial instability.  Plan for trial of physical therapy which should be quite helpful.  Additionally recommend Voltaren gel.  Consider medial off loader knee brace.  This may help with stability and pain as well.  We talked about knee injections were can hold off on that for a little while..  Recheck in 1 month or 6 weeks.   PDMP not reviewed this encounter. Orders Placed This Encounter  Procedures   [place] LIMITED JOINT SPACE STRUCTURES LOW BILAT(NO LINKED CHARGES)    Order Specific Question:   Reason for Exam (SYMPTOM  OR DIAGNOSIS REQUIRED)    Answer:   bilateral knee pain    Order Specific Question:   Preferred imaging location?    Answer:   [place]   DG Knee AP/LAT W/Sunrise Left    Standing Status:   Future    Number of Occurrences:   1    Standing Expiration Date:   03/20/2023    Order Specific Question:   Reason for Exam (SYMPTOM  OR DIAGNOSIS REQUIRED)    Answer:   bilateral knee pain    Order Specific Question:   Preferred imaging location?    Answer:   [facility]   DG Knee AP/LAT W/Sunrise Right    Standing Status:   Future  Number of Occurrences:   1    Standing Expiration Date:   03/20/2023    Order Specific Question:   Reason for Exam (SYMPTOM  OR DIAGNOSIS REQUIRED)    Answer:   bilateral knee pain    Order Specific Question:   Preferred imaging location?    Answer:   [facility]   Ambulatory referral to Physical Therapy    Referral Priority:   Routine    Referral Type:   Physical Medicine    Referral Reason:   Specialty Services Required    Requested Specialty:   Physical Therapy    Number of Visits  Requested:   1   No orders of the defined types were placed in this encounter.   Discussed warning signs or symptoms. Please see discharge instructions. Patient expresses understanding.   The above documentation has been reviewed and is accurate and complete Lynne Leader, M.D.

## 2023-02-19 NOTE — Progress Notes (Signed)
Right knee x-ray shows severe arthritis changes in the knee.

## 2023-02-19 NOTE — Progress Notes (Signed)
Left knee x-ray shows some arthritis changes in the knee.

## 2023-02-26 DIAGNOSIS — M25561 Pain in right knee: Secondary | ICD-10-CM | POA: Diagnosis not present

## 2023-02-26 DIAGNOSIS — M25562 Pain in left knee: Secondary | ICD-10-CM | POA: Diagnosis not present

## 2023-02-26 DIAGNOSIS — G8929 Other chronic pain: Secondary | ICD-10-CM | POA: Diagnosis not present

## 2023-02-26 DIAGNOSIS — R269 Unspecified abnormalities of gait and mobility: Secondary | ICD-10-CM | POA: Diagnosis not present

## 2023-02-27 DIAGNOSIS — J3089 Other allergic rhinitis: Secondary | ICD-10-CM | POA: Diagnosis not present

## 2023-02-27 DIAGNOSIS — J4540 Moderate persistent asthma, uncomplicated: Secondary | ICD-10-CM | POA: Diagnosis not present

## 2023-02-27 DIAGNOSIS — J3081 Allergic rhinitis due to animal (cat) (dog) hair and dander: Secondary | ICD-10-CM | POA: Diagnosis not present

## 2023-02-27 DIAGNOSIS — J301 Allergic rhinitis due to pollen: Secondary | ICD-10-CM | POA: Diagnosis not present

## 2023-03-06 DIAGNOSIS — M25561 Pain in right knee: Secondary | ICD-10-CM | POA: Diagnosis not present

## 2023-03-06 DIAGNOSIS — M25562 Pain in left knee: Secondary | ICD-10-CM | POA: Diagnosis not present

## 2023-03-06 DIAGNOSIS — G8929 Other chronic pain: Secondary | ICD-10-CM | POA: Diagnosis not present

## 2023-03-06 DIAGNOSIS — R269 Unspecified abnormalities of gait and mobility: Secondary | ICD-10-CM | POA: Diagnosis not present

## 2023-03-11 ENCOUNTER — Ambulatory Visit: Payer: Medicare PPO | Admitting: Sports Medicine

## 2023-03-11 VITALS — HR 64 | Ht 74.0 in | Wt 218.0 lb

## 2023-03-11 DIAGNOSIS — G8929 Other chronic pain: Secondary | ICD-10-CM

## 2023-03-11 DIAGNOSIS — M170 Bilateral primary osteoarthritis of knee: Secondary | ICD-10-CM | POA: Diagnosis not present

## 2023-03-11 MED ORDER — MELOXICAM 15 MG PO TABS: 15.0000 mg | ORAL_TABLET | Freq: Every day | ORAL | 0 refills | Status: AC

## 2023-03-11 NOTE — Progress Notes (Signed)
Benito Mccreedy D.[facility] [place] Phone: [phone]   Assessment and Plan:    1. Chronic pain of both knees 2. Bilateral primary osteoarthritis of knee -Chronic with exacerbation, subsequent visit - Mild improvement in right knee pain, however left knee pain worsening since previous office visit.  Both are still consistent with flares of osteoarthritis based on HPI, physical exam, x-ray imaging - Reviewed x-ray imaging with patient in office.  Showed how he had mild to moderate medial degenerative changes in right knee, and mild degenerative changes in left knee - Patient is planning a trip to [place] with his wife in 2 weeks.  In order to try and optimize knee function prior to this trip, patient elected for bilateral intra-articular CSI today.  Tolerated well per note below - May start meloxicam 15 mg daily as needed for pain relief.  May use Tylenol as needed for breakthrough pain.  Prescription meloxicam provided today   Procedure: Knee Joint Injection Side: Bilateral Indication: Flare of osteoarthritis  Risks explained and consent was given verbally. The site was cleaned with alcohol prep. A needle was introduced with an anterio-lateral approach. Injection given using 60mL of 1% lidocaine without epinephrine and 58mL of kenalog 40mg /ml. This was well tolerated and resulted in symptomatic relief.  Needle was removed, hemostasis achieved, and post injection instructions were explained.  Procedure was repeated on contralateral side.  Pt was advised to call or return to clinic if these symptoms worsen or fail to improve as anticipated.   Pertinent previous records reviewed include left knee x-ray 02/17/2023, right knee x-ray 02/17/2023   Follow Up: As needed if no improvement or worsening of symptoms in 4 to 6 weeks.  Could consider alternative injections such as HA, PRP, Zilretta.  Could also consider NSAID course    Subjective:   I, Moenique Parris, am serving as a Education administrator for Doctor Glennon Mac  Chief Complaint: bilat knee pain   HPI:   02/17/2023  Patient is a 72 year old male presenting with bilateral knee pain ongoing in R knee since Oct and L knee since the holidays.  Pt notes [place] [facility] [employer] and had been running about 1/2 mile per day. Pt reports hx of meniscal injury in his R knee. Pt d/c running and working out until about 2 weeks ago. Pt is wondering if the development of knee pain is related to receiving the COVID booster. Patient locates pain to the anterior aspect of both knees.   Knee swelling: no Mechanical symptoms: yes Aggravates: stiff in the mornings, prolonged sitting then transitioning to stand Treatments tried: rest, Tylenol   Dx testing 03/31/2022 bilat LE vascular US  03/11/23 Patient states that he thinks his pain is moving to the left knee , right knee has stabilized and the left knee is swollen with severe pain through the patella a week ago , had been going to PT   Relevant Historical Information: None pertinent  Additional pertinent review of systems negative.   Current Outpatient Medications:    albuterol (PROVENTIL HFA;VENTOLIN HFA) 108 (90 Base) MCG/ACT inhaler, Inhale 2 puffs into the lungs every 6 (six) hours as needed for wheezing or shortness of breath., Disp: , Rfl:    EPINEPHrine 0.3 mg/0.3 mL IJ SOAJ injection, Inject 0.3 mg into the muscle once., Disp: , Rfl:    loratadine (CLARITIN) 10 MG tablet, Take 10 mg by mouth daily., Disp: , Rfl:  meloxicam (MOBIC) 15 MG tablet, Take 1 tablet (15 mg total) by mouth daily., Disp: 30 tablet, Rfl: 0   mometasone-formoterol (DULERA) 100-5 MCG/ACT AERO, Inhale 2 puffs into the lungs 2 (two) times daily., Disp: , Rfl:    montelukast (SINGULAIR) 10 MG tablet, Take 10 mg by mouth every evening., Disp: , Rfl: 5   sildenafil (VIAGRA) 100 MG tablet, Take 50 mg by mouth daily as needed for erectile  dysfunction., Disp: , Rfl:    Objective:     Vitals:   03/11/23 1249  Pulse: 64  SpO2: 98%  Weight: 218 lb (98.9 kg)  Height: 6\' 2"  (1.88 m)      Body mass index is 27.99 kg/m.    Physical Exam:    General:  awake, alert oriented, no acute distress nontoxic Skin: no suspicious lesions or rashes Neuro:sensation intact and strength 5/5 with no deficits, no atrophy, normal muscle tone Psych: No signs of anxiety, depression or other mood disorder  Bilateral knee: Mild left swelling and no swelling on right No deformity Neg fluid wave, joint milking ROM Flex 110, Ext 0 TTP medial joint line NTTP over the quad tendon, medial fem condyle, lat fem condyle, patella, plica, patella tendon, tibial tuberostiy, fibular head, posterior fossa, pes anserine bursa, gerdy's tubercle,  lateral jt line  Gait normal    Electronically signed by:  Benito Mccreedy D.[facility] 1:15 PM 03/11/23

## 2023-03-11 NOTE — Patient Instructions (Addendum)
Good to see you  - Start meloxicam 15 mg daily as needed . May use Tylenol [phone] mg 2 to 3 times a day for breakthrough pain. Follow up as needed , in 4-6 weeks if no improvement

## 2023-04-08 DIAGNOSIS — J069 Acute upper respiratory infection, unspecified: Secondary | ICD-10-CM | POA: Diagnosis not present

## 2023-04-08 DIAGNOSIS — J309 Allergic rhinitis, unspecified: Secondary | ICD-10-CM | POA: Diagnosis not present

## 2023-04-08 DIAGNOSIS — Z6827 Body mass index (BMI) 27.0-27.9, adult: Secondary | ICD-10-CM | POA: Diagnosis not present

## 2023-04-08 DIAGNOSIS — R55 Syncope and collapse: Secondary | ICD-10-CM | POA: Diagnosis not present

## 2023-06-01 NOTE — Progress Notes (Unsigned)
  Cardiology Office Note:   Date:  06/03/2023  ID:  Nathan Mendez, DOB 28-Aug-1951, MRN [id number]  History of Present Illness:   Nathan Mendez is a 72 y.o. male with history of asthma and HLD who was referred by Linton Rump, NP for further evaluation of syncope.  Patient seen by PCP office on 04/11/23. Notes reviewed. Patient reported that while on a cruise he developed significant sinus congestion and took a hot shower and then passed out while standing and shaving afterwards. Had another episode while sitting on the bed. He was evaluated on the ship where ECG reportedly showed "an extra beat" but other work-up was reassuring. He is now referred to Cardiology for further evaluation.  Today, the patient states that he is overall doing well. No recurrent syncope since April. No chest pain, SOB, orthopnea or PND. Exercises 4x/week without exertional symptoms. States that he has a history of syncope usually in the setting of stress/sickness/injections.   Past Medical History:  Diagnosis Date   ED (erectile dysfunction)    High triglycerides    Kidney stone    Sinus bradycardia      ROS: As per HPI  Studies Reviewed:    EKG:  NSR, inferior q-waves, LVH-personally reviewed       Risk Assessment/Calculations:              Physical Exam:   VS:  BP 138/86   Pulse (!) 54   Ht 6\' 2"  (1.88 m)   Wt 215 lb (97.5 kg)   SpO2 98%   BMI 27.60 kg/m    Wt Readings from Last 3 Encounters:  06/03/23 215 lb (97.5 kg)  03/11/23 218 lb (98.9 kg)  02/17/23 217 lb (98.4 kg)     GEN: Well nourished, well developed in no acute distress NECK: No JVD; No carotid bruits CARDIAC: RRR, 2/6 systolic murmur. No rubs, gallops RESPIRATORY:  Clear to auscultation without rales, wheezing or rhonchi  ABDOMEN: Soft, non-tender, non-distended EXTREMITIES:  No edema; No deformity   ASSESSMENT AND PLAN:    #Syncope: -Suspect orthostatic in nature given that it occurred after a hot shower and while  patient was ill -Has had prior episodes of syncope as well in the setting of illness/injections again supportive of vasovagal/orthostatic events -Currently very active without anginal symptoms and no recurrence of syncopal episode -Will check TTE and zio monitor for further evaluation -Discussed conservative measures as well including hydration, small frequent meals, compression socks and slow position changes  #Abnormal ECG: -Asymptomatic and active without anginal symptoms -Check TTE for further evaluation  #History of PE: -On apxiaban 2.5mg  BID; follows with PCP        Signed, Meriam Sprague, MD

## 2023-06-03 ENCOUNTER — Ambulatory Visit (INDEPENDENT_AMBULATORY_CARE_PROVIDER_SITE_OTHER): Payer: Medicare PPO

## 2023-06-03 ENCOUNTER — Ambulatory Visit: Payer: Medicare PPO | Attending: Cardiology | Admitting: Cardiology

## 2023-06-03 ENCOUNTER — Telehealth: Payer: Self-pay | Admitting: *Deleted

## 2023-06-03 ENCOUNTER — Encounter: Payer: Self-pay | Admitting: Cardiology

## 2023-06-03 VITALS — BP 138/86 | HR 54 | Ht 74.0 in | Wt 215.0 lb

## 2023-06-03 DIAGNOSIS — Z86711 Personal history of pulmonary embolism: Secondary | ICD-10-CM | POA: Diagnosis not present

## 2023-06-03 DIAGNOSIS — R9431 Abnormal electrocardiogram [ECG] [EKG]: Secondary | ICD-10-CM

## 2023-06-03 DIAGNOSIS — R55 Syncope and collapse: Secondary | ICD-10-CM | POA: Diagnosis not present

## 2023-06-03 DIAGNOSIS — E782 Mixed hyperlipidemia: Secondary | ICD-10-CM

## 2023-06-03 NOTE — Patient Instructions (Signed)
Medication Instructions:   Your physician recommends that you continue on your current medications as directed. Please refer to the Current Medication list given to you today.  *If you need a refill on your cardiac medications before your next appointment, please call your pharmacy*   Testing/Procedures:  Your physician has requested that you have an echocardiogram. Echocardiography is a painless test that uses sound waves to create images of your heart. It provides your doctor with information about the size and shape of your heart and how well your heart's chambers and valves are working. This procedure takes approximately one hour. There are no restrictions for this procedure. Please do NOT wear cologne, perfume, aftershave, or lotions (deodorant is allowed). Please arrive 15 minutes prior to your appointment time.    ZIO XT- Long Term Monitor Instructions  Your physician has requested you wear a ZIO patch monitor for 7 days.  This is a single patch monitor. [employer] supplies one patch monitor per enrollment. Additional stickers are not available. Please do not apply patch if you will be having a Nuclear Stress Test,  Echocardiogram, Cardiac CT, MRI, or Chest Xray during the period you would be wearing the  monitor. The patch cannot be worn during these tests. You cannot remove and re-apply the  ZIO XT patch monitor.  Your ZIO patch monitor will be mailed 3 day USPS to your address on file. It may take 3-5 days  to receive your monitor after you have been enrolled.  Once you have received your monitor, please review the enclosed instructions. Your monitor  has already been registered assigning a specific monitor serial # to you.  Billing and Patient Assistance Program Information  We have supplied [employer] with any of your insurance information on file for billing purposes. [employer] offers a sliding scale Patient Assistance Program for patients that do not have  insurance, or whose  insurance does not completely cover the cost of the ZIO monitor.  You must apply for the Patient Assistance Program to qualify for this discounted rate.  To apply, please call [employer] at [phone], select option 4, select option 2, ask to apply for  Patient Assistance Program. Meredeth Ide will ask your household income, and how many people  are in your household. They will quote your out-of-pocket cost based on that information.  [employer] will also be able to set up a 55-month, interest-free payment plan if needed.  Applying the monitor   Shave hair from upper left chest.  Hold abrader disc by orange tab. Rub abrader in 40 strokes over the upper left chest as  indicated in your monitor instructions.  Clean area with 4 enclosed alcohol pads. Let dry.  Apply patch as indicated in monitor instructions. Patch will be placed under collarbone on left  side of chest with arrow pointing upward.  Rub patch adhesive wings for 2 minutes. Remove white label marked "1". Remove the white  label marked "2". Rub patch adhesive wings for 2 additional minutes.  While looking in a mirror, press and release button in center of patch. A small green light will  flash 3-4 times. This will be your only indicator that the monitor has been turned on.  Do not shower for the first 24 hours. You may shower after the first 24 hours.  Press the button if you feel a symptom. You will hear a small click. Record Date, Time and  Symptom in the Patient Logbook.  When you are ready to remove the patch, follow instructions  on the last 2 pages of Patient  Logbook. Stick patch monitor onto the last page of Patient Logbook.  Place Patient Logbook in the blue and white box. Use locking tab on box and tape box closed  securely. The blue and white box has prepaid postage on it. Please place it in the mailbox as  soon as possible. Your physician should have your test results approximately 7 days after the  monitor has been mailed back  to [facility].  Call [facility] Customer Care at [phone] if you have questions regarding  your ZIO XT patch monitor. Call them immediately if you see an orange light blinking on your  monitor.  If your monitor falls off in less than 4 days, contact our Monitor department at [phone].  If your monitor becomes loose or falls off after 4 days call [employer] at [phone] for  suggestions on securing your monitor    Follow-Up: At [facility], you and your health needs are our priority.  As part of our continuing mission to provide you with exceptional heart care, we have created designated Provider Care Teams.  These Care Teams include your primary Cardiologist (physician) and Advanced Practice Providers (APPs -  Physician Assistants and Nurse Practitioners) who all work together to provide you with the care you need, when you need it.  We recommend signing up for the patient portal called "MyChart".  Sign up information is provided on this After Visit Summary.  MyChart is used to connect with patients for Virtual Visits (Telemedicine).  Patients are able to view lab/test results, encounter notes, upcoming appointments, etc.  Non-urgent messages can be sent to your provider as well.   To learn more about what you can do with MyChart, go to [email or web address].    Your next appointment:   1 year(s)  Provider:   Dr. Dietrich Pates

## 2023-06-03 NOTE — Telephone Encounter (Signed)
-----   Message from Ernst Bowler sent at 06/03/2023  2:16 PM EDT ----- Regarding: RE: 7 day zio done ----- Message ----- From: Loa Socks, LPN Sent: 9/56/2130   1:42 PM EDT To: Ernst Bowler; Katrina Claria Dice Subject: 7 day zio                                      7 day zio ordered for abnormal EKG and syncope  Please enroll and let me know when you do?  Thanks [employer]

## 2023-06-03 NOTE — Progress Notes (Unsigned)
Enrolled for [employer] to mail a ZIO XT long term holter monitor to the patients address on file.  

## 2023-06-06 DIAGNOSIS — R9431 Abnormal electrocardiogram [ECG] [EKG]: Secondary | ICD-10-CM

## 2023-06-06 DIAGNOSIS — R55 Syncope and collapse: Secondary | ICD-10-CM | POA: Diagnosis not present

## 2023-06-18 DIAGNOSIS — R55 Syncope and collapse: Secondary | ICD-10-CM | POA: Diagnosis not present

## 2023-06-18 DIAGNOSIS — R9431 Abnormal electrocardiogram [ECG] [EKG]: Secondary | ICD-10-CM | POA: Diagnosis not present

## 2023-06-24 ENCOUNTER — Ambulatory Visit (HOSPITAL_COMMUNITY): Payer: Medicare PPO | Attending: Cardiology

## 2023-06-24 DIAGNOSIS — R55 Syncope and collapse: Secondary | ICD-10-CM | POA: Insufficient documentation

## 2023-06-24 DIAGNOSIS — R9431 Abnormal electrocardiogram [ECG] [EKG]: Secondary | ICD-10-CM | POA: Insufficient documentation

## 2023-06-24 LAB — ECHOCARDIOGRAM COMPLETE
Area-P 1/2: 3.42 cm2
S' Lateral: 2.15 cm

## 2023-06-25 DIAGNOSIS — D6869 Other thrombophilia: Secondary | ICD-10-CM | POA: Diagnosis not present

## 2023-06-25 DIAGNOSIS — Z6827 Body mass index (BMI) 27.0-27.9, adult: Secondary | ICD-10-CM | POA: Diagnosis not present

## 2023-06-25 DIAGNOSIS — Z0000 Encounter for general adult medical examination without abnormal findings: Secondary | ICD-10-CM | POA: Diagnosis not present

## 2023-12-03 IMAGING — CR DG CHEST 2V
2 series · 2 of 2 positions shown · non-contrast
Comparison: 03/28/2021

CLINICAL DATA: Syncope

EXAM:
CHEST - 2 VIEW

[chest pa]
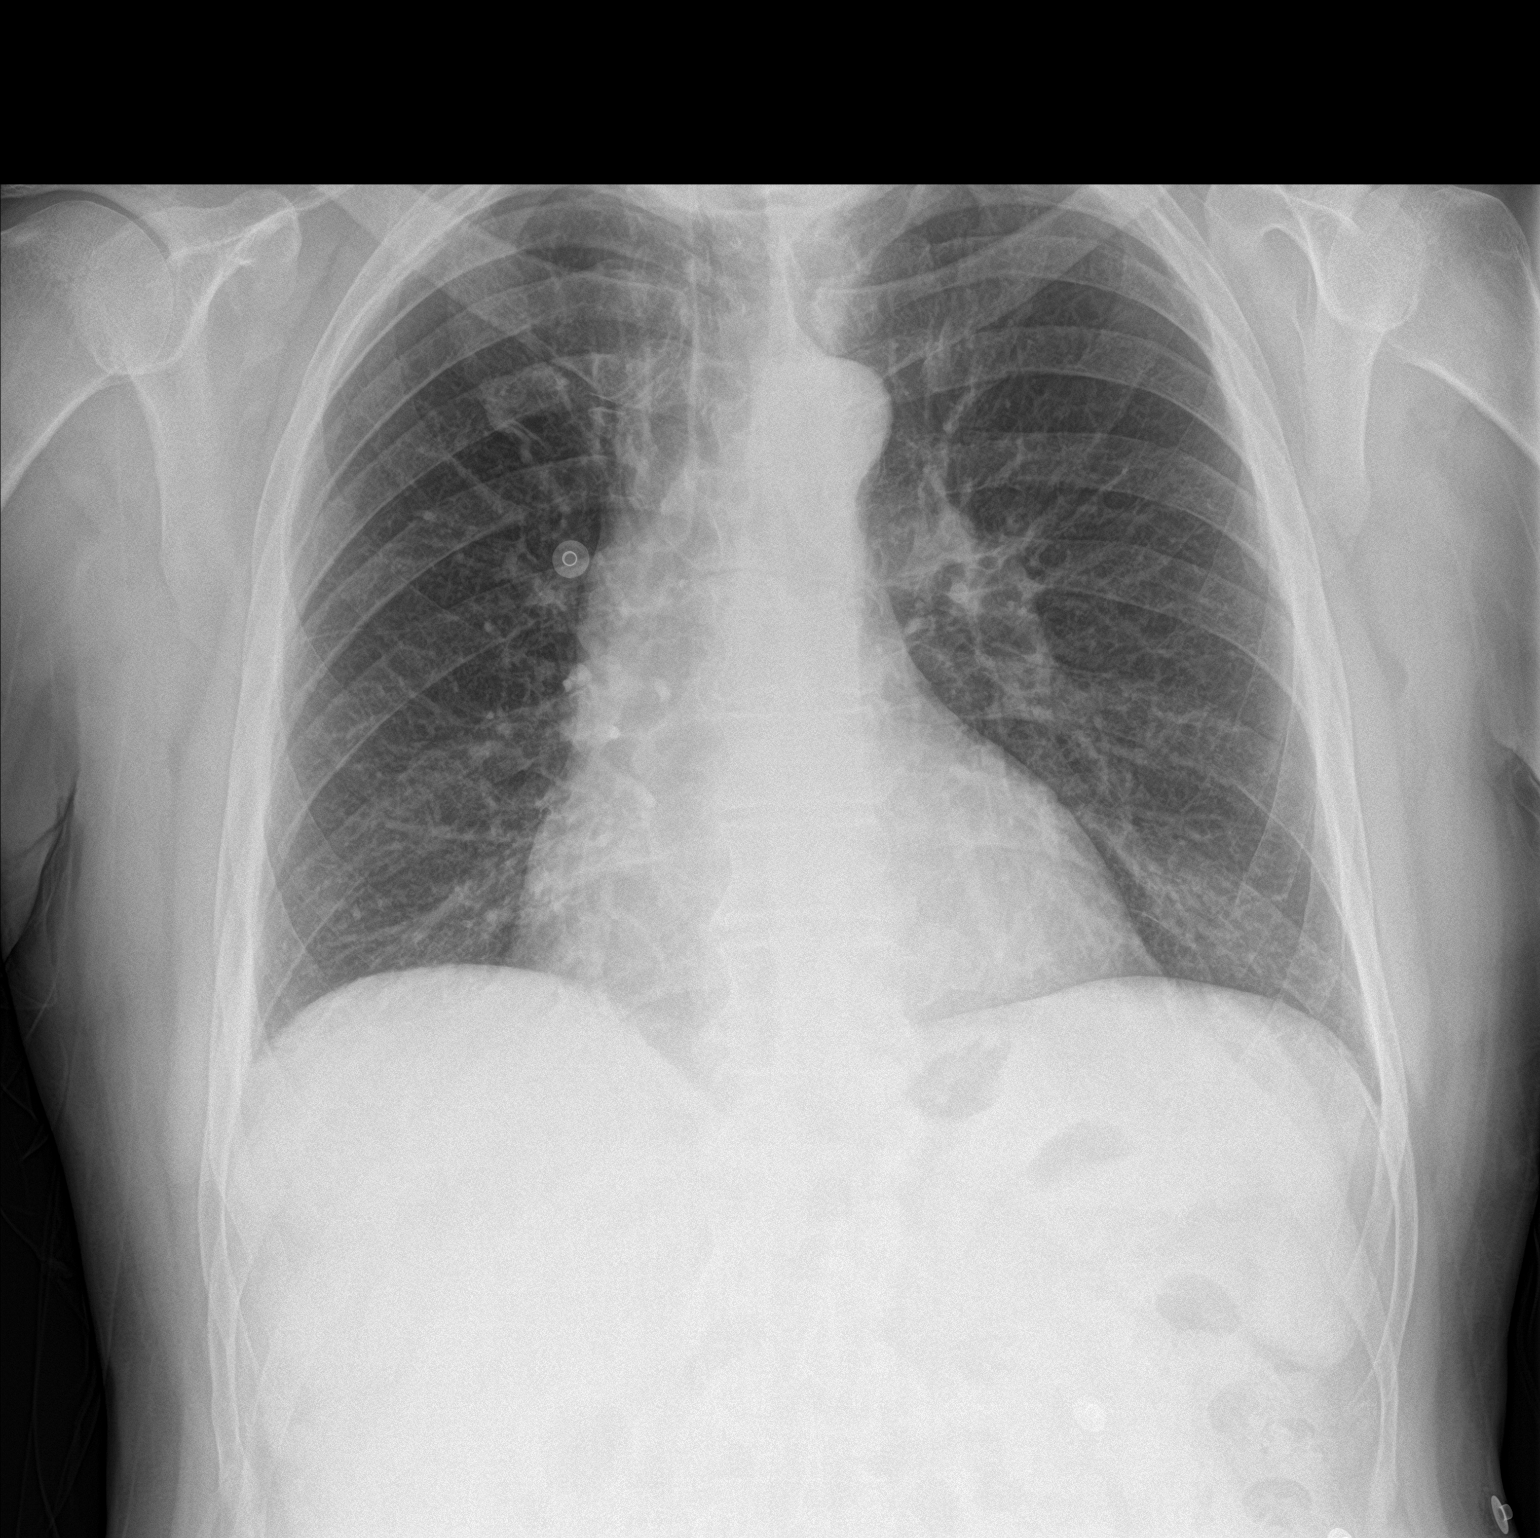

[chest lat]
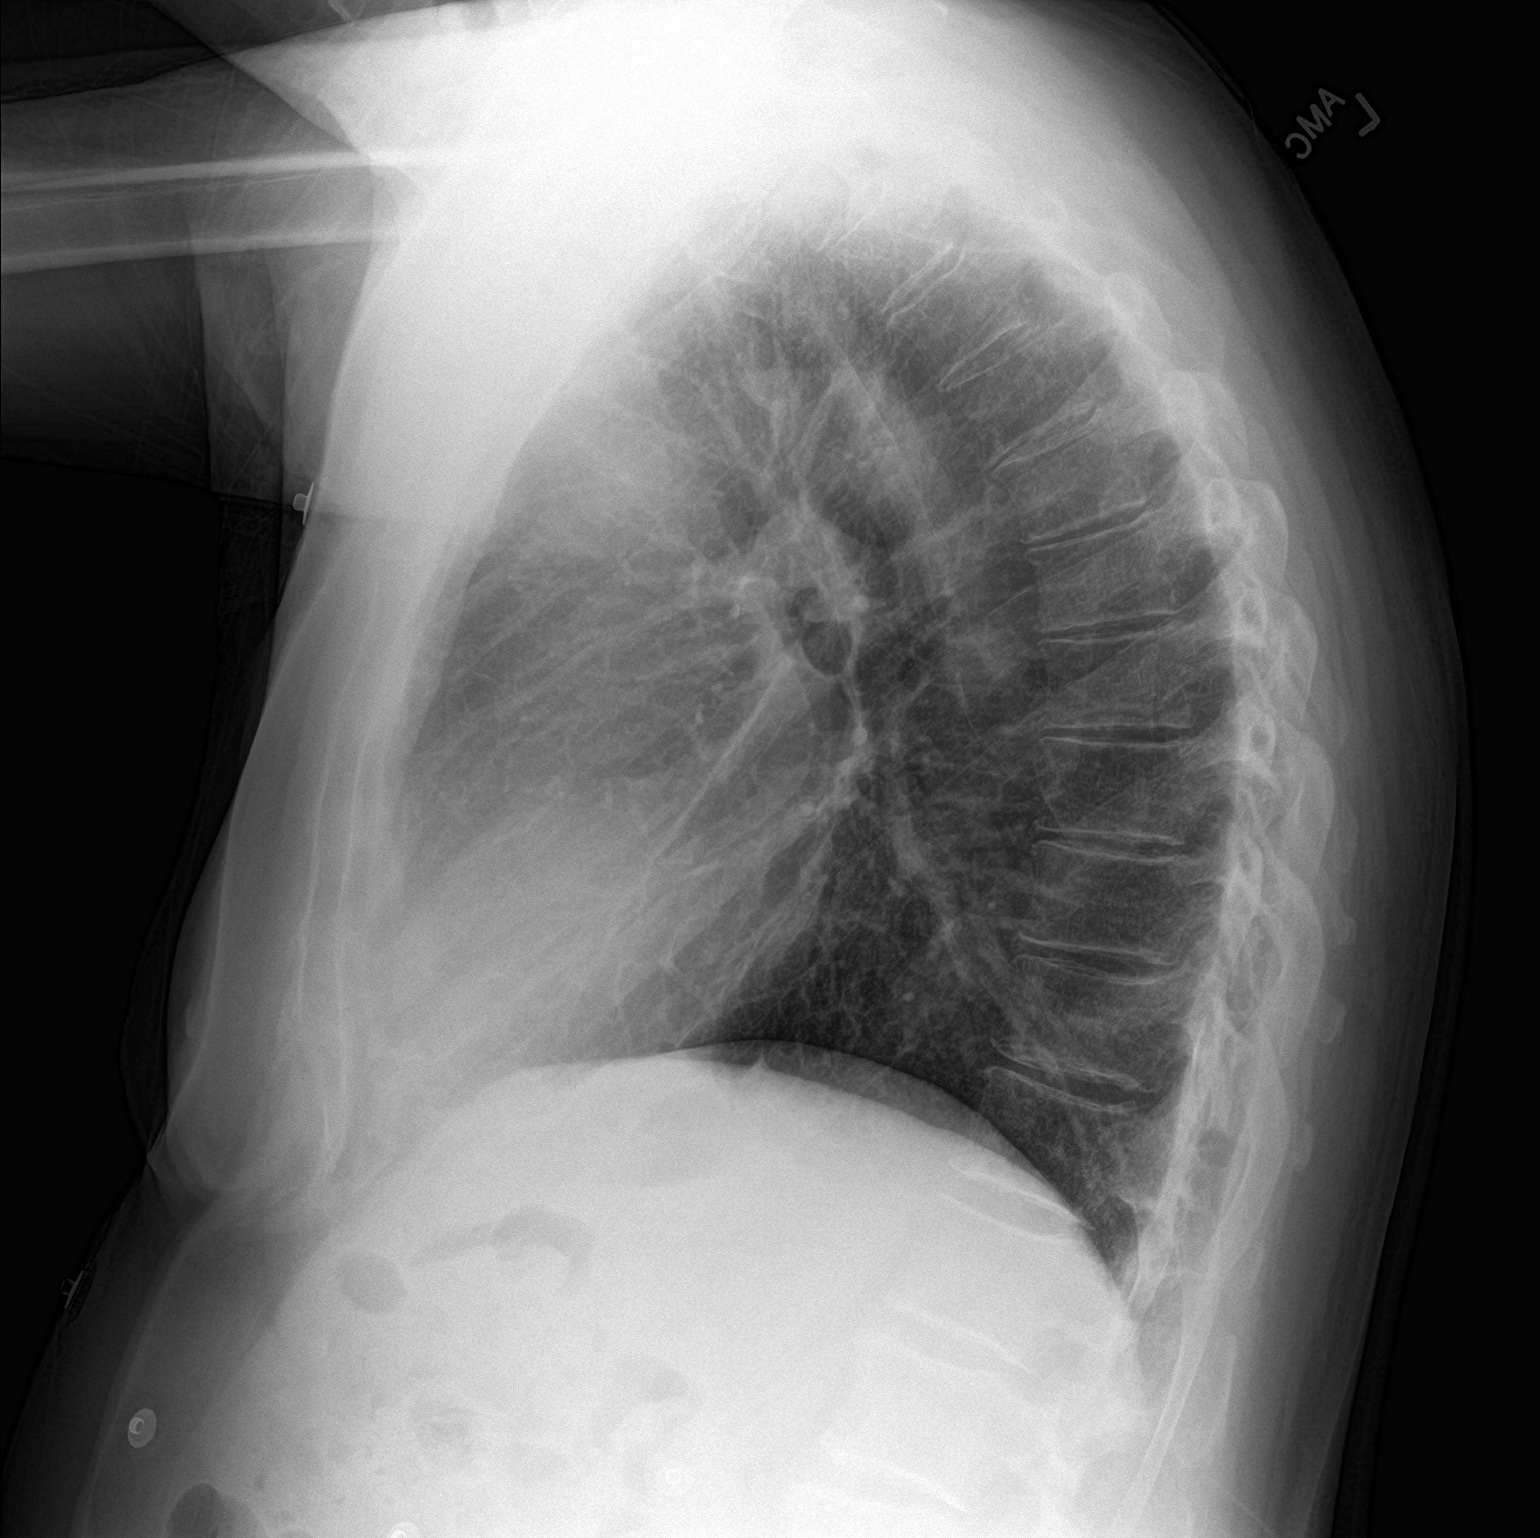

[2 of 2 positions shown; findings below may reference images not displayed]

FINDINGS: Transverse diameter of heart is slightly increased. There are no
signs of pulmonary edema or focal pulmonary consolidation. There is
interval clearing of right pleural effusion and infiltrate in the
right lower lung fields. There is no pleural effusion or
pneumothorax.
IMPRESSION: No active cardiopulmonary disease.

## 2024-03-05 DIAGNOSIS — J301 Allergic rhinitis due to pollen: Secondary | ICD-10-CM | POA: Diagnosis not present

## 2024-03-05 DIAGNOSIS — J3089 Other allergic rhinitis: Secondary | ICD-10-CM | POA: Diagnosis not present

## 2024-03-05 DIAGNOSIS — J4540 Moderate persistent asthma, uncomplicated: Secondary | ICD-10-CM | POA: Diagnosis not present

## 2024-03-05 DIAGNOSIS — J3081 Allergic rhinitis due to animal (cat) (dog) hair and dander: Secondary | ICD-10-CM | POA: Diagnosis not present

## 2024-07-12 DIAGNOSIS — Z125 Encounter for screening for malignant neoplasm of prostate: Secondary | ICD-10-CM | POA: Diagnosis not present

## 2024-07-12 DIAGNOSIS — E785 Hyperlipidemia, unspecified: Secondary | ICD-10-CM | POA: Diagnosis not present

## 2024-07-12 DIAGNOSIS — Z0000 Encounter for general adult medical examination without abnormal findings: Secondary | ICD-10-CM | POA: Diagnosis not present

## 2024-07-12 DIAGNOSIS — Z7901 Long term (current) use of anticoagulants: Secondary | ICD-10-CM | POA: Diagnosis not present

## 2024-09-03 ENCOUNTER — Encounter: Payer: Self-pay | Admitting: Podiatry

## 2024-09-03 ENCOUNTER — Ambulatory Visit: Admitting: Podiatry

## 2024-09-03 DIAGNOSIS — L600 Ingrowing nail: Secondary | ICD-10-CM

## 2024-09-03 DIAGNOSIS — M79672 Pain in left foot: Secondary | ICD-10-CM | POA: Diagnosis not present

## 2024-09-03 NOTE — Patient Instructions (Signed)

## 2024-09-03 NOTE — Progress Notes (Signed)
 Patient complains of painful ingrown thickened nail hallux left.  He is of the nails been getting worse and worse over the years.  Started to curve over painful at the hammertoe.. Patient denies fevers, chills, nausea, vomiting.  Says it occasionally bleeds.  Objective:  Vitals: Reviewed  General: Well developed, nourished, in no acute distress, alert and oriented x3   Vascular: DP pulse 2/4 bilateral. PT pulse 2 to/4 bilateral.  Mild edema lower extremity bilaterally.  Capillary refill time immediate bilaterally  Dermatology: Thickened dystrophic gryphotic painful hallux nail left with no drainage .  Nail borders are hypertrophied.  Some redness along the proximal nail fold and distal aspect of the hallux.  Tenderness present with palpation. Normal skin tone and texture feet with normal hair growth.  Neurological: Grossly intact. Normal reflexes.   Musculoskeletal: Tenderness with palpation of the distal hallux left. No tenderness or painful ROM at IPJ.  Diagnosis: 1.  Pain left foot. 2.  Ingrown nail hallux left  Plan: -New patient office visit for evaluation and management.  Modifier 25. Discussed with him the nail.  Given the severe thickening of the nail and dystrophic changes would recommend permanent removal of the nails he is also have the option like to proceed with permanent removal of the nail -discussed etiology and treatment of ingrown nails. Discussed surgical vs conservative treatment. -Consent signed for appropriate matrixectomy affected nail(s).   Procedure(s):   - Matrixectomy(s) total nail hallux left: Toe anesthetized with 3cc 2:1 mixture 2% Lidocaine with epinephrine: Sodium Bicarbonate. Surgical site prepped. Digital tourniquet applied.  Avulsion of nail plate. performed. Matrixecomy performed with three 30 second applications of phenol to nail matrix. Site irrigated with alcohol.  Tourniquet released with good vascularity noticed in digit.  Applied triple antibiotic  to nailbed and applied gauze and Coban dressing. - Written and oral postoperative instructions given.  -Return for post-op 2 weeks.  JINNY Prentice Binder, DPM

## 2024-09-17 ENCOUNTER — Encounter: Payer: Self-pay | Admitting: Podiatry

## 2024-09-17 ENCOUNTER — Ambulatory Visit: Payer: Self-pay | Admitting: Podiatry

## 2024-09-17 DIAGNOSIS — L600 Ingrowing nail: Secondary | ICD-10-CM

## 2024-09-17 NOTE — Progress Notes (Signed)
 Patient presents follow-up nail surgery .  No complaints.  Physical exam:  Dermatologic: Nail surgery site for matrixectomy healing well with no signs of infection.  Diagnosis: 1.  Status post matrixectomy toe.  Healing well  Plan: - POV status post nail surgery if patient has any problems she can call for appointment otherwise we can see her as needed - Return as needed

## 2024-09-20 DIAGNOSIS — Z860101 Personal history of adenomatous and serrated colon polyps: Secondary | ICD-10-CM | POA: Diagnosis not present

## 2024-09-20 DIAGNOSIS — Z7901 Long term (current) use of anticoagulants: Secondary | ICD-10-CM | POA: Diagnosis not present

## 2024-09-20 DIAGNOSIS — Z86711 Personal history of pulmonary embolism: Secondary | ICD-10-CM | POA: Diagnosis not present

## 2024-10-29 DIAGNOSIS — H524 Presbyopia: Secondary | ICD-10-CM | POA: Diagnosis not present

## 2024-10-29 DIAGNOSIS — H18513 Endothelial corneal dystrophy, bilateral: Secondary | ICD-10-CM | POA: Diagnosis not present

## 2024-10-29 DIAGNOSIS — H25813 Combined forms of age-related cataract, bilateral: Secondary | ICD-10-CM | POA: Diagnosis not present

## 2024-10-29 DIAGNOSIS — H5213 Myopia, bilateral: Secondary | ICD-10-CM | POA: Diagnosis not present

## 2024-10-29 DIAGNOSIS — H52223 Regular astigmatism, bilateral: Secondary | ICD-10-CM | POA: Diagnosis not present

## 2024-11-09 DIAGNOSIS — R051 Acute cough: Secondary | ICD-10-CM | POA: Diagnosis not present

## 2024-11-09 DIAGNOSIS — J029 Acute pharyngitis, unspecified: Secondary | ICD-10-CM | POA: Diagnosis not present

## 2024-11-10 DIAGNOSIS — R059 Cough, unspecified: Secondary | ICD-10-CM | POA: Diagnosis not present

## 2024-11-10 DIAGNOSIS — J4520 Mild intermittent asthma, uncomplicated: Secondary | ICD-10-CM | POA: Diagnosis not present

## 2024-11-10 DIAGNOSIS — Z6826 Body mass index (BMI) 26.0-26.9, adult: Secondary | ICD-10-CM | POA: Diagnosis not present
